# Patient Record
Sex: Male | Born: 1970 | Race: White | Hispanic: No | Marital: Single | Smoking: Current every day smoker
Health system: Southern US, Community
[De-identification: ages and names within clinical notes are randomized; demographics above are authoritative.]

## PROBLEM LIST (undated history)

## (undated) DIAGNOSIS — I10 Essential (primary) hypertension: Secondary | ICD-10-CM

## (undated) DIAGNOSIS — R519 Headache, unspecified: Secondary | ICD-10-CM

## (undated) DIAGNOSIS — E119 Type 2 diabetes mellitus without complications: Secondary | ICD-10-CM

## (undated) DIAGNOSIS — G629 Polyneuropathy, unspecified: Secondary | ICD-10-CM

## (undated) DIAGNOSIS — C801 Malignant (primary) neoplasm, unspecified: Secondary | ICD-10-CM

## (undated) DIAGNOSIS — J449 Chronic obstructive pulmonary disease, unspecified: Secondary | ICD-10-CM

## (undated) DIAGNOSIS — G4730 Sleep apnea, unspecified: Secondary | ICD-10-CM

## (undated) HISTORY — PX: APPENDECTOMY: SHX54

## (undated) HISTORY — PX: OTHER SURGICAL HISTORY: SHX169

---

## 2011-08-11 ENCOUNTER — Emergency Department: Payer: Self-pay | Admitting: Emergency Medicine

## 2014-11-29 DIAGNOSIS — I219 Acute myocardial infarction, unspecified: Secondary | ICD-10-CM

## 2014-11-29 HISTORY — DX: Acute myocardial infarction, unspecified: I21.9

## 2021-09-21 NOTE — H&P (Addendum)
  Patient referred by general dentist for extraction all remaining teeth.  CC: Dental pain.  Past Medical History:  High Blood Pressure, Smoker, Diabetes, Low Blood Sugar, Swollen ankles, Eye Disease or Glaucoma, Mental Health problems, Obese    Medications: Lyrica,  Patient referred by Sheryn Bison, DDS for extraction all remaining teeth.  CC: Dental pain.  Past Medical History:  High Blood Pressure, Smoker, Diabetes, Low Blood Sugar, Swollen ankles, Eye Disease or Glaucoma, Mental Health problems, Obese    Medications: Lyrica, Trulicity, Insulin, Glucagon, Amlodipine, Symbicort, Voltaren, Neurontin, Depakote, Lisinopril, Protonix, Zoloft, Carafate, Topamax,   Allergies:     Aspirin    Surgeries:   Gallbladder removal     Social History       Smoking: 1 ppd           Alcohol: n Drug use:n                             Exam: BMI  34. Gross decay of all remaining teeth. 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  No purulence, edema, fluctuance, trismus. Oral cancer screening negative. Pharynx clear. No lymphadenopathy. Cor-RRR Lungs-Clear  Panorex: from 10/02/2020. Gross decay of all remaining teeth. Impacted #1 CB, retained root #4.l   Assessment: ASA 3. All teeth non-restorable  secondary to dental caries. Lesion right ventral tongue.              Plan: Extraction Teeth # 1 (CB), 3, 4 (retained root), 5, 6, 7, 8, 9, 10, 11, 12, 13, 14, 15, 20, 21, 22, 25, 26, 27, 28, 29, 30. Alveoloplasty. Biopsy tongue lesion.  Hospital Day surgery.                 Rx: n               Risks and complications explained. Questions answered.   Gae Bon, DMD   Allergies:     Aspirin    Surgeries:   Gallbladder removal     Social History       Smoking: 1 ppd           Alcohol: n Drug use:n                             Exam: BMI  34. Gross decay of all remaining teeth. 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  No purulence, edema, fluctuance, trismus. Oral  cancer screening negative. Pharynx clear. No lymphadenopathy.  Panorex: from 10/02/2020. Gross decay of all remaining teeth. Impacted #1 CB, retained root #4.l   Assessment: ASA 3. All teeth non-restorable  secondary to dental caries. Lesion right ventral tongue.              Plan: Extraction Teeth # 1 (CB), 3, 4 (retained root), 5, 6, 7, 8, 9, 10, 11, 12, 13, 14, 15, 20, 21, 22, 25, 26, 27, 28, 29, 30. Alveoloplasty. Biopsy tongue lesion.  Hospital Day surgery.                 Rx: n               Risks and complications explained. Questions answered.   Gae Bon, DMD

## 2021-09-24 ENCOUNTER — Encounter (HOSPITAL_COMMUNITY): Payer: Self-pay | Admitting: Oral Surgery

## 2021-09-24 NOTE — Progress Notes (Signed)
Anesthesia Chart Review: Same day workup  Pertinent medical history includes dyslipidemia, IDDM2, HTN, GERD, COPD, OSA on CPAP.  Evaluated by cardiology at [facility] November 2021 for atypical chest pain.  Echo showed mildly dilated left ventricle, EF 55-60%.  No significant valvular stenosis or regurgitation. Cardiolite 09/29/2020 showed no inducible ischemia.  Uncontrolled IDDM2, last A1c 8.5 on 09/11/21. This si followed by Vance Peper, NP. Pt is maintained on Trulicity and insulin P-824. He was instructed to call prescriber's office for instruction on U-500 inulin.  Patient will need day of surgery labs and evaluation.  Nuclear stress 09/29/20 (care everywhere): Impression:  1. Normal vasodilator stress ECG.  2. Low-normal LV systolic function with normal wall motion.  3. No evidence of inducible ischemia by MPI.  4. Low-risk pharmacologic MPI.   TTE 09/29/20 (care everywhere): Left Ventricle: Mildly dilated left ventricular size.    Left Ventricle: Wall thickness is normal.    Left Ventricle: Systolic function is normal. EF: 55-60%.    Left Ventricle: Wall motion is normal.    Right Ventricle: Right ventricle was not well visualized. Right  ventricle appears normal.    Right Ventricle: Systolic function is normal. Normal tricuspid annular  plane systolic excursion (TAPSE) >1.7 cm.    Pericardium: There is no pericardial effusion.   No significant valvular stenosis or regurgitation.     Wynonia Musty [facility] Short Stay Center/Anesthesiology Phone [phone] 09/24/2021 3:55 PM

## 2021-09-24 NOTE — Progress Notes (Addendum)
[name] denies chest pain or shortness of breath. Patient denies having any s/s of Covid in his household.  Patient denies any known exposure to Covid.  [name] has type II diabetes, patient checks CBG 4 times a day.  Endocrinologist is Transport planner.  [name] is on Novolog U- 500, patient will call  and ask how much Insulin he should take tonight and in the am. Patient takes Trulicity on Wednesday. I instructed [name] to check CBG when he wakes up and every 2 hours until he leaves home to come to the hospital.  I instructed patient that if CBG is  < 80 treat with Glucgon power per  Liliana Pitu, NP's instruction  instructions  Recheck CBg in 15 minutes.  I instructed patient to shower with antibiotic soap, if it is available.  Dry off with a clean towel. Do not put lotion, powder, cologne or deodorant or makeup.No jewelry or piercings. Men may shave their face and neck. Woman should not shave. No nail polish, artificial or acrylic nails. Wear clean clothes, brush your teeth. Glasses, contact lens,dentures or partials may not be worn in the OR. If you need to wear them, please bring a case for glasses, do not wear contacts or bring a case, the hospital does not have contact cases, dentures or partials will have to be removed , make sure they are clean, we will provide a denture cup to put them in. You will need some one to drive you home and a responsible person over the age of 19 to stay with you for the first 24 hours after surgery.

## 2021-09-24 NOTE — Anesthesia Preprocedure Evaluation (Addendum)
Anesthesia Evaluation  Patient identified by MRN, date of birth, ID band Patient awake    Reviewed: Allergy & Precautions, NPO status , Patient's Chart, lab work & pertinent test results  History of Anesthesia Complications Negative for: history of anesthetic complications  Airway Mallampati: II  TM Distance: >3 FB Neck ROM: Full    Dental  (+) Poor Dentition, Dental Advisory Given   Pulmonary sleep apnea and Continuous Positive Airway Pressure Ventilation , COPD, Current Smoker and Patient abstained from smoking.,    Pulmonary exam normal        Cardiovascular hypertension, Normal cardiovascular exam  Nuclear stress 09/29/20 (care everywhere): Impression:  1. Normal vasodilator stress ECG.  2. Low-normal LV systolic function with normal wall motion.  3. No evidence of inducible ischemia by MPI.  4. Low-risk pharmacologic MPI.   TTE 09/29/20 (care everywhere): LeftVentricle: Mildly dilated left ventricular size.  Marland Kitchen LeftVentricle: Wall thickness is normal.  . LeftVentricle: Systolic function is normal. EF: 55-60%.  . LeftVentricle: Wall motion is normal.  . RightVentricle: Right ventricle was not well visualized. Right  ventricle appears normal.  . RightVentricle: Systolic function is normal. Normal tricuspid annular  plane systolic excursion (TAPSE) >1.7 cm.  . Pericardium: There is no pericardial effusion.   No significant valvular stenosis orregurgitation.     Neuro/Psych negative neurological ROS     GI/Hepatic negative GI ROS, Neg liver ROS,   Endo/Other  diabetes  Renal/GU negative Renal ROS     Musculoskeletal negative musculoskeletal ROS (+)   Abdominal   Peds  Hematology negative hematology ROS (+)   Anesthesia Other Findings   Reproductive/Obstetrics                           Anesthesia Physical Anesthesia Plan  ASA: 3  Anesthesia Plan: General    Post-op Pain Management:    Induction: Intravenous  PONV Risk Score and Plan: 2 and Ondansetron, Dexamethasone and Midazolam  Airway Management Planned: Nasal ETT  Additional Equipment:   Intra-op Plan:   Post-operative Plan: Extubation in OR  Informed Consent: I have reviewed the patients History and Physical, chart, labs and discussed the procedure including the risks, benefits and alternatives for the proposed anesthesia with the patient or authorized representative who has indicated his/her understanding and acceptance.     Dental advisory given  Plan Discussed with: Anesthesiologist and CRNA  Anesthesia Plan Comments: (PAT note by Karoline Caldwell, PA-C: Pertinent medical history includes dyslipidemia, IDDM2, HTN, GERD, COPD, OSA on CPAP.  Evaluated by cardiology at [facility] November 2021 for atypical chest pain.  Echo showed mildly dilated left ventricle, EF 55-60%.  No significant valvular stenosis or regurgitation. Cardiolite 09/29/2020 showed no inducible ischemia.  Uncontrolled IDDM2, last A1c 8.5 on 09/11/21. This si followed by Vance Peper, NP. Pt is maintained on Trulicity and insulin Z-767. He was instructed to call prescriber's office for instruction on U-500 inulin.  Patient will need day of surgery labs and evaluation.  Nuclear stress 09/29/20 (care everywhere): Impression:  1. Normal vasodilator stress ECG.  2. Low-normal LV systolic function with normal wall motion.  3. No evidence of inducible ischemia by MPI.  4. Low-risk pharmacologic MPI.   TTE 09/29/20 (care everywhere): LeftVentricle: Mildly dilated left ventricular size.  Marland Kitchen LeftVentricle: Wall thickness is normal.  . LeftVentricle: Systolic function is normal. EF: 55-60%.  . LeftVentricle: Wall motion is normal.  . RightVentricle: Right ventricle was not well visualized. Right  ventricle appears  normal.  . RightVentricle: Systolic function is normal. Normal tricuspid annular  plane  systolic excursion (TAPSE) >1.7 cm.  . Pericardium: There is no pericardial effusion.   No significant valvular stenosis orregurgitation.   )      Anesthesia Quick Evaluation

## 2021-09-25 ENCOUNTER — Ambulatory Visit (HOSPITAL_COMMUNITY): Payer: Medicaid Other | Admitting: Physician Assistant

## 2021-09-25 ENCOUNTER — Ambulatory Visit (HOSPITAL_COMMUNITY): Payer: Medicaid Other

## 2021-09-25 ENCOUNTER — Other Ambulatory Visit: Payer: Self-pay

## 2021-09-25 ENCOUNTER — Encounter (HOSPITAL_COMMUNITY)
Admission: RE | Disposition: A | Discharge status: Home / Self Care | Payer: Self-pay | Source: Home / Self Care | Attending: Oral Surgery

## 2021-09-25 ENCOUNTER — Ambulatory Visit (HOSPITAL_COMMUNITY)
Admission: RE | Admit: 2021-09-25 | Discharge: 2021-09-25 | Disposition: A | Discharge status: Home / Self Care | Payer: Medicaid Other | Attending: Oral Surgery | Admitting: Oral Surgery

## 2021-09-25 ENCOUNTER — Encounter (HOSPITAL_COMMUNITY): Payer: Self-pay | Admitting: Oral Surgery

## 2021-09-25 DIAGNOSIS — Z7984 Long term (current) use of oral hypoglycemic drugs: Secondary | ICD-10-CM | POA: Insufficient documentation

## 2021-09-25 DIAGNOSIS — Z419 Encounter for procedure for purposes other than remedying health state, unspecified: Secondary | ICD-10-CM

## 2021-09-25 DIAGNOSIS — E669 Obesity, unspecified: Secondary | ICD-10-CM | POA: Diagnosis not present

## 2021-09-25 DIAGNOSIS — Z98811 Dental restoration status: Secondary | ICD-10-CM

## 2021-09-25 DIAGNOSIS — I10 Essential (primary) hypertension: Secondary | ICD-10-CM | POA: Diagnosis not present

## 2021-09-25 DIAGNOSIS — K056 Periodontal disease, unspecified: Secondary | ICD-10-CM | POA: Insufficient documentation

## 2021-09-25 DIAGNOSIS — Z886 Allergy status to analgesic agent status: Secondary | ICD-10-CM | POA: Insufficient documentation

## 2021-09-25 DIAGNOSIS — Z7951 Long term (current) use of inhaled steroids: Secondary | ICD-10-CM | POA: Insufficient documentation

## 2021-09-25 DIAGNOSIS — F17200 Nicotine dependence, unspecified, uncomplicated: Secondary | ICD-10-CM | POA: Diagnosis not present

## 2021-09-25 DIAGNOSIS — Z794 Long term (current) use of insulin: Secondary | ICD-10-CM | POA: Insufficient documentation

## 2021-09-25 DIAGNOSIS — E119 Type 2 diabetes mellitus without complications: Secondary | ICD-10-CM | POA: Insufficient documentation

## 2021-09-25 DIAGNOSIS — Z6834 Body mass index (BMI) 34.0-34.9, adult: Secondary | ICD-10-CM | POA: Diagnosis not present

## 2021-09-25 DIAGNOSIS — K029 Dental caries, unspecified: Secondary | ICD-10-CM | POA: Diagnosis not present

## 2021-09-25 DIAGNOSIS — Z791 Long term (current) use of non-steroidal anti-inflammatories (NSAID): Secondary | ICD-10-CM | POA: Insufficient documentation

## 2021-09-25 DIAGNOSIS — Z79899 Other long term (current) drug therapy: Secondary | ICD-10-CM | POA: Diagnosis not present

## 2021-09-25 DIAGNOSIS — K148 Other diseases of tongue: Secondary | ICD-10-CM | POA: Diagnosis not present

## 2021-09-25 DIAGNOSIS — F17210 Nicotine dependence, cigarettes, uncomplicated: Secondary | ICD-10-CM | POA: Diagnosis not present

## 2021-09-25 HISTORY — DX: Type 2 diabetes mellitus without complications: E11.9

## 2021-09-25 HISTORY — PX: TOOTH EXTRACTION: SHX859

## 2021-09-25 HISTORY — DX: Chronic obstructive pulmonary disease, unspecified: J44.9

## 2021-09-25 HISTORY — PX: TONGUE BIOPSY: SHX6575

## 2021-09-25 HISTORY — DX: Headache, unspecified: R51.9

## 2021-09-25 HISTORY — DX: Polyneuropathy, unspecified: G62.9

## 2021-09-25 HISTORY — DX: Sleep apnea, unspecified: G47.30

## 2021-09-25 HISTORY — DX: Malignant (primary) neoplasm, unspecified: C80.1

## 2021-09-25 HISTORY — DX: Essential (primary) hypertension: I10

## 2021-09-25 LAB — BASIC METABOLIC PANEL
Anion gap: 9 (ref 5–15)
BUN: 7 mg/dL (ref 6–20)
CO2: 20 mmol/L — ABNORMAL LOW (ref 22–32)
Calcium: 9.1 mg/dL (ref 8.9–10.3)
Chloride: 107 mmol/L (ref 98–111)
Creatinine, Ser: 0.68 mg/dL (ref 0.61–1.24)
GFR, Estimated: >60 mL/min (ref >60)
Glucose, Bld: 113 mg/dL — ABNORMAL HIGH (ref 70–99)
Potassium: 3.7 mmol/L (ref 3.5–5.1)
Sodium: 136 mmol/L (ref 135–145)

## 2021-09-25 LAB — CBC
HCT: 45.6 % (ref 39.0–52.0)
Hemoglobin: 15.5 g/dL (ref 13.0–17.0)
MCH: 28 pg (ref 26.0–34.0)
MCHC: 34 g/dL (ref 30.0–36.0)
MCV: 82.3 fL (ref 80.0–100.0)
Platelets: 208 10*3/uL (ref 150–400)
RBC: 5.54 MIL/uL (ref 4.22–5.81)
RDW: 13.1 % (ref 11.5–15.5)
WBC: 12.1 10*3/uL — ABNORMAL HIGH (ref 4.0–10.5)
nRBC: 0 % (ref 0.0–0.2)

## 2021-09-25 LAB — GLUCOSE, CAPILLARY: Glucose-Capillary: 118 mg/dL — ABNORMAL HIGH (ref 70–99)

## 2021-09-25 SURGERY — DENTAL RESTORATION/EXTRACTIONS
Anesthesia: General

## 2021-09-25 MED ORDER — FENTANYL CITRATE (PF) 100 MCG/2ML IJ SOLN
INTRAMUSCULAR | Status: AC
  Filled 2021-09-25: qty 2

## 2021-09-25 MED ORDER — CHLORHEXIDINE GLUCONATE 0.12 % MT SOLN
OROMUCOSAL | Status: AC
  Administered 2021-09-25: 15 mL via OROMUCOSAL
  Filled 2021-09-25: qty 15

## 2021-09-25 MED ORDER — 0.9 % SODIUM CHLORIDE (POUR BTL) OPTIME
TOPICAL | Status: DC | PRN
  Administered 2021-09-25: 1000 mL

## 2021-09-25 MED ORDER — CEFAZOLIN SODIUM-DEXTROSE 2-4 GM/100ML-% IV SOLN
2.0000 g | INTRAVENOUS | Status: AC
  Administered 2021-09-25: 2 g via INTRAVENOUS

## 2021-09-25 MED ORDER — SUGAMMADEX SODIUM 200 MG/2ML IV SOLN
INTRAVENOUS | Status: DC | PRN
  Administered 2021-09-25: 20 mg via INTRAVENOUS

## 2021-09-25 MED ORDER — ONDANSETRON HCL 4 MG/2ML IJ SOLN
INTRAMUSCULAR | Status: DC | PRN
  Administered 2021-09-25: 4 mg via INTRAVENOUS

## 2021-09-25 MED ORDER — OXYCODONE-ACETAMINOPHEN 5-325 MG PO TABS: 1.0000 | ORAL_TABLET | ORAL | 0 refills | Status: AC | PRN

## 2021-09-25 MED ORDER — LIDOCAINE-EPINEPHRINE 2 %-1:100000 IJ SOLN
INTRAMUSCULAR | Status: DC | PRN
  Administered 2021-09-25: 20 mL via SUBCUTANEOUS

## 2021-09-25 MED ORDER — OXYCODONE HCL 5 MG PO TABS
5.0000 mg | ORAL_TABLET | Freq: Once | ORAL | Status: AC
  Administered 2021-09-25: 5 mg via ORAL

## 2021-09-25 MED ORDER — FENTANYL CITRATE (PF) 100 MCG/2ML IJ SOLN
INTRAMUSCULAR | Status: DC | PRN
  Administered 2021-09-25: 100 ug via INTRAVENOUS
  Administered 2021-09-25 (×3): 50 ug via INTRAVENOUS

## 2021-09-25 MED ORDER — LACTATED RINGERS IV SOLN: INTRAVENOUS | Status: DC

## 2021-09-25 MED ORDER — FENTANYL CITRATE (PF) 250 MCG/5ML IJ SOLN
INTRAMUSCULAR | Status: AC
  Filled 2021-09-25: qty 5

## 2021-09-25 MED ORDER — LABETALOL HCL 5 MG/ML IV SOLN
INTRAVENOUS | Status: AC
  Filled 2021-09-25: qty 4

## 2021-09-25 MED ORDER — AMISULPRIDE (ANTIEMETIC) 5 MG/2ML IV SOLN: 10.0000 mg | Freq: Once | INTRAVENOUS | Status: DC | PRN

## 2021-09-25 MED ORDER — SODIUM CHLORIDE 0.9 % IR SOLN
Status: DC | PRN
  Administered 2021-09-25: 1000 mL

## 2021-09-25 MED ORDER — PROPOFOL 10 MG/ML IV BOLUS
INTRAVENOUS | Status: AC
  Filled 2021-09-25: qty 20

## 2021-09-25 MED ORDER — MIDAZOLAM HCL 5 MG/5ML IJ SOLN
INTRAMUSCULAR | Status: DC | PRN
  Administered 2021-09-25: 2 mg via INTRAVENOUS

## 2021-09-25 MED ORDER — SUCCINYLCHOLINE CHLORIDE 200 MG/10ML IV SOSY
PREFILLED_SYRINGE | INTRAVENOUS | Status: DC | PRN
  Administered 2021-09-25: 140 mg via INTRAVENOUS

## 2021-09-25 MED ORDER — ORAL CARE MOUTH RINSE: 15.0000 mL | Freq: Once | OROMUCOSAL | Status: AC

## 2021-09-25 MED ORDER — PROMETHAZINE HCL 25 MG/ML IJ SOLN: 6.2500 mg | INTRAMUSCULAR | Status: DC | PRN

## 2021-09-25 MED ORDER — OXYCODONE HCL 5 MG PO TABS
ORAL_TABLET | ORAL | Status: AC
  Filled 2021-09-25: qty 1

## 2021-09-25 MED ORDER — FENTANYL CITRATE (PF) 100 MCG/2ML IJ SOLN
25.0000 ug | INTRAMUSCULAR | Status: DC | PRN
  Administered 2021-09-25 (×3): 50 ug via INTRAVENOUS

## 2021-09-25 MED ORDER — DEXAMETHASONE SODIUM PHOSPHATE 4 MG/ML IJ SOLN
INTRAMUSCULAR | Status: DC | PRN
  Administered 2021-09-25: 10 mg via INTRAVENOUS

## 2021-09-25 MED ORDER — PROPOFOL 10 MG/ML IV BOLUS
INTRAVENOUS | Status: DC | PRN
  Administered 2021-09-25: 200 mg via INTRAVENOUS
  Administered 2021-09-25: 100 mg via INTRAVENOUS

## 2021-09-25 MED ORDER — OXYMETAZOLINE HCL 0.05 % NA SOLN
1.0000 | Freq: Two times a day (BID) | NASAL | Status: DC
  Administered 2021-09-25: 1 via NASAL
  Filled 2021-09-25: qty 30

## 2021-09-25 MED ORDER — ACETAMINOPHEN 500 MG PO TABS
1000.0000 mg | ORAL_TABLET | Freq: Once | ORAL | Status: AC
  Administered 2021-09-25: 1000 mg via ORAL
  Filled 2021-09-25: qty 2

## 2021-09-25 MED ORDER — LABETALOL HCL 5 MG/ML IV SOLN
5.0000 mg | INTRAVENOUS | Status: DC | PRN
  Administered 2021-09-25: 5 mg via INTRAVENOUS

## 2021-09-25 MED ORDER — LIDOCAINE 2% (20 MG/ML) 5 ML SYRINGE
INTRAMUSCULAR | Status: DC | PRN
  Administered 2021-09-25: 50 mg via INTRAVENOUS

## 2021-09-25 MED ORDER — CHLORHEXIDINE GLUCONATE 0.12 % MT SOLN: 15.0000 mL | Freq: Once | OROMUCOSAL | Status: AC

## 2021-09-25 MED ORDER — CEFAZOLIN SODIUM-DEXTROSE 2-4 GM/100ML-% IV SOLN
INTRAVENOUS | Status: AC
  Filled 2021-09-25: qty 100

## 2021-09-25 MED ORDER — ALBUTEROL SULFATE HFA 108 (90 BASE) MCG/ACT IN AERS
INHALATION_SPRAY | RESPIRATORY_TRACT | Status: DC | PRN
  Administered 2021-09-25 (×3): 2 via RESPIRATORY_TRACT

## 2021-09-25 MED ORDER — AMOXICILLIN 500 MG PO CAPS: 500.0000 mg | ORAL_CAPSULE | Freq: Three times a day (TID) | ORAL | 0 refills | Status: AC

## 2021-09-25 MED ORDER — ROCURONIUM BROMIDE 10 MG/ML (PF) SYRINGE
PREFILLED_SYRINGE | INTRAVENOUS | Status: DC | PRN
  Administered 2021-09-25: 40 mg via INTRAVENOUS

## 2021-09-25 MED ORDER — LIDOCAINE-EPINEPHRINE 2 %-1:100000 IJ SOLN
INTRAMUSCULAR | Status: AC
  Filled 2021-09-25: qty 1

## 2021-09-25 MED ORDER — LACTATED RINGERS IV SOLN: INTRAVENOUS | Status: DC | PRN

## 2021-09-25 MED ORDER — MIDAZOLAM HCL 2 MG/2ML IJ SOLN
INTRAMUSCULAR | Status: AC
  Filled 2021-09-25: qty 2

## 2021-09-25 SURGICAL SUPPLY — 40 items
BAG COUNTER SPONGE SURGICOUNT (BAG) IMPLANT
BLADE SURG 15 STRL LF DISP TIS (BLADE) ×1 IMPLANT
BLADE SURG 15 STRL SS (BLADE) ×1
BUR CROSS CUT FISSURE 1.6 (BURR) ×2 IMPLANT
BUR EGG ELITE 4.0 (BURR) ×2 IMPLANT
CANISTER SUCT 3000ML PPV (MISCELLANEOUS) ×2 IMPLANT
COVER SURGICAL LIGHT HANDLE (MISCELLANEOUS) ×2 IMPLANT
DECANTER SPIKE VIAL GLASS SM (MISCELLANEOUS) ×2 IMPLANT
DRAPE U-SHAPE 76X120 STRL (DRAPES) IMPLANT
ELECT REM PT RETURN 9FT ADLT (ELECTROSURGICAL) ×2
ELECTRODE REM PT RTRN 9FT ADLT (ELECTROSURGICAL) ×1 IMPLANT
GAUZE 4X4 16PLY ~~LOC~~+RFID DBL (SPONGE) ×2 IMPLANT
GAUZE PACKING FOLDED 2  STR (GAUZE/BANDAGES/DRESSINGS) ×1
GAUZE PACKING FOLDED 2 STR (GAUZE/BANDAGES/DRESSINGS) ×1 IMPLANT
GLOVE SURG ENC MOIS LTX SZ6.5 (GLOVE) IMPLANT
GLOVE SURG ENC MOIS LTX SZ7 (GLOVE) ×2 IMPLANT
GLOVE SURG ENC MOIS LTX SZ8 (GLOVE) ×2 IMPLANT
GLOVE SURG UNDER POLY LF SZ6.5 (GLOVE) IMPLANT
GLOVE SURG UNDER POLY LF SZ7 (GLOVE) ×2 IMPLANT
GOWN STRL REUS W/ TWL LRG LVL3 (GOWN DISPOSABLE) ×1 IMPLANT
GOWN STRL REUS W/ TWL XL LVL3 (GOWN DISPOSABLE) ×1 IMPLANT
GOWN STRL REUS W/TWL LRG LVL3 (GOWN DISPOSABLE) ×1
GOWN STRL REUS W/TWL XL LVL3 (GOWN DISPOSABLE) ×1
IV NS 1000ML (IV SOLUTION) ×1
IV NS 1000ML BAXH (IV SOLUTION) ×1 IMPLANT
KIT BASIN OR (CUSTOM PROCEDURE TRAY) ×2 IMPLANT
KIT TURNOVER KIT B (KITS) ×2 IMPLANT
NEEDLE HYPO 25GX1X1/2 BEV (NEEDLE) ×4 IMPLANT
NS IRRIG 1000ML POUR BTL (IV SOLUTION) ×2 IMPLANT
PAD ARMBOARD 7.5X6 YLW CONV (MISCELLANEOUS) ×2 IMPLANT
PENCIL BUTTON HOLSTER BLD 10FT (ELECTRODE) ×2 IMPLANT
SLEEVE IRRIGATION ELITE 7 (MISCELLANEOUS) ×2 IMPLANT
SPONGE SURGIFOAM ABS GEL 12-7 (HEMOSTASIS) IMPLANT
SUT CHROMIC 3 0 PS 2 (SUTURE) ×6 IMPLANT
SUT CHROMIC 4 0 PS 5 (SUTURE) ×2 IMPLANT
SYR BULB IRRIG 60ML STRL (SYRINGE) IMPLANT
SYR CONTROL 10ML LL (SYRINGE) ×2 IMPLANT
TRAY ENT MC OR (CUSTOM PROCEDURE TRAY) ×2 IMPLANT
TUBING IRRIGATION (MISCELLANEOUS) ×2 IMPLANT
YANKAUER SUCT BULB TIP NO VENT (SUCTIONS) ×2 IMPLANT

## 2021-09-25 NOTE — Progress Notes (Signed)
At 1419, pt placed in phase II. Waiting on final read of chest x ray and KUB for discharge.

## 2021-09-25 NOTE — Transfer of Care (Signed)
Immediate Anesthesia Transfer of Care Note  Patient: [name]  Procedure(s) Performed: DENTAL RESTORATION/EXTRACTIONS TONGUE BIOPSY  Patient Location: PACU  Anesthesia Type:General  Level of Consciousness: awake and oriented  Airway & Oxygen Therapy: Patient Spontanous Breathing and Patient connected to face mask  Post-op Assessment: Report given to RN and Post -op Vital signs reviewed and stable  Post vital signs: stable  Last Vitals:  Vitals Value Taken Time  BP    Temp    Pulse 103 09/25/21 1303  Resp 25 09/25/21 1303  SpO2 89 % 09/25/21 1303  Vitals shown include unvalidated device data.  Last Pain:  Vitals:   09/25/21 0816  TempSrc:   PainSc: 0-No pain      Patients Stated Pain Goal: 3 (96/22/29 7989)  Complications: No notable events documented.

## 2021-09-25 NOTE — Op Note (Signed)
09/25/2021  12:46 PM  PATIENT:  [name]  50 y.o. male  PRE-OPERATIVE DIAGNOSIS:  NON-RESTORABLE TEETH # 3, 4, 5, 6, 7, 8, 9, 10, 11, 12, 13, 14, 15, 20, 21, 22, 25, 26, 27, 28, 29, 30 SECONDARY TO PERIODONTAL DISEASE AND DENTAL CARIES, LESION RIGHT VENTRAL TONGUE  POST-OPERATIVE DIAGNOSIS:  SAME + TOOTH #4 NOT PRESENT  PROCEDURE:  Procedure(s): DENTAL EXTRACTIONS TEETH # 3, 4, 5, 6, 7, 8, 9, 10, 11, 12, 13, 14, 15, 20, 21, 22, 25, 26, 27, 28, 29, 30 TONGUE BIOPSY  SURGEON:  Surgeon(s): Diona Browner, DMD  ANESTHESIA:   local and general  EBL:  minimal  DRAINS: none   SPECIMEN:  LESION RIGHT TONGUE  COUNTS:  YES  PLAN OF CARE: Discharge to home after PACU  PATIENT DISPOSITION:  PACU - hemodynamically stable.   PROCEDURE DETAILS: Dictation # [id number]  Gae Bon, DMD 09/25/2021 12:46 PM

## 2021-09-25 NOTE — Anesthesia Procedure Notes (Signed)
Procedure Name: Intubation Date/Time: 09/25/2021 11:12 AM Performed by: Yehuda Mao, CRNA Pre-anesthesia Checklist: Patient identified, Emergency Drugs available, Suction available and Patient being monitored Patient Re-evaluated:Patient Re-evaluated prior to induction Oxygen Delivery Method: Circle system utilized Preoxygenation: Pre-oxygenation with 100% oxygen Induction Type: IV induction Ventilation: Mask ventilation without difficulty Laryngoscope Size: Mac and 4 Grade View: Grade III Tube type: Oral Tube size: 7.5 mm Number of attempts: 4 Airway Equipment and Method: Stylet and Oral airway Placement Confirmation: ETT inserted through vocal cords under direct vision, positive ETCO2 and breath sounds checked- equal and bilateral Tube secured with: Tape Dental Injury: Teeth and Oropharynx as per pre-operative assessment

## 2021-09-25 NOTE — Progress Notes (Signed)
H&P documentation  -History and Physical Reviewed  -Patient has been re-examined  -No change in the plan of care  [name]  

## 2021-09-25 NOTE — Anesthesia Postprocedure Evaluation (Signed)
Anesthesia Post Note  Patient: [name]  Procedure(s) Performed: DENTAL RESTORATION/EXTRACTIONS TONGUE BIOPSY     Patient location during evaluation: PACU Anesthesia Type: General Level of consciousness: sedated Pain management: pain level controlled Vital Signs Assessment: post-procedure vital signs reviewed and stable Respiratory status: spontaneous breathing and respiratory function stable Cardiovascular status: stable Postop Assessment: no apparent nausea or vomiting Anesthetic complications: no   No notable events documented.  Last Vitals:  Vitals:   09/25/21 1401 09/25/21 1419  BP: (!) 157/121 (!) 145/110  Pulse: 84 88  Resp: 20 17  Temp:    SpO2: 92% 96%    Last Pain:  Vitals:   09/25/21 1401  TempSrc:   PainSc: 6                  Charitie Hinote DANIEL

## 2021-09-25 NOTE — OR Nursing (Signed)
Per protocol Dr. Lin Landsman called on 09-25-2021 at 1214 to confirm there were no foreign bodies retained or aspirated. MD is aware of results. Birder Robson RN.

## 2021-09-26 ENCOUNTER — Encounter (HOSPITAL_COMMUNITY): Payer: Self-pay | Admitting: Oral Surgery

## 2021-09-26 NOTE — Op Note (Signed)
[name], [name] MEDICAL RECORD NO: [id number] ACCOUNT NO: [id number] DATE OF BIRTH: 08/14/1971 FACILITY: [facility] LOCATION: [facility] PHYSICIAN: Gae Bon, DDS  Operative Report   DATE OF PROCEDURE: 09/25/2021   PREOPERATIVE DIAGNOSIS:   Nonrestorable teeth secondary to dental caries and periodontal disease numbers 3, 4, 5, 6, 7, 8, 9, 10, 11, 12, 13, 14, 15, 20, 21, 22, 25, 26, 27, 28, 29, 30.   POSTOPERATIVE DIAGNOSIS:   Nonrestorable teeth secondary to dental caries and periodontal disease numbers 3, 4, 5, 6, 7, 8, 9, 10, 11, 12, 13, 14, 15, 20, 21, 22, 25, 26, 27, 28, 29, 30.  Tooth #4 not present.   PROCEDURE:  Extraction of teeth numbers 3, 4, 5, 6, 7, 8, 9, 10, 11, 12, 13, 14, 15, 20, 21, 22, 25, 26, 27, 28, 29, 30.    BIOPSY:  Right ventral tongue.   SURGEON:  Gae Bon, DDS.  ANESTHESIA:  General, nasal intubation.    Dr. Tobias Alexander attending.  DESCRIPTION OF PROCEDURE:  The patient was taken to the operating room and placed on the table in supine position.  General anesthesia was administered.  A nasal endotracheal tube was placed and secured.  Eyes were protected and the patient was draped  for surgery.  Timeout was performed.  The posterior pharynx was suctioned and a throat pack was placed.  2% lidocaine with 1:100,000 epinephrine was infiltrated into inferior alveolar block on the right and left sides and then buccal and palatal  infiltration of the maxilla around the teeth to be removed.  Buccal anesthesia was also administered in the anterior mandible.  A bite block was placed on the right side of the mouth and a sweetheart retractor was used to retract the tongue.  A 15 blade  used to make an incision around teeth numbers 20, 21, 22 carried across the midline to tooth #25 and 26 in the buccal and lingual sulcus.  The periosteum was reflected.  The teeth were elevated with 301 elevator and removed from the mouth with a dental  forceps.  The sockets were curetted and  the left mandible teeth #20, 21, 22 was irrigated and then closed with 3-0 chromic.  Attention was turned to the left maxilla.  A 15 blade was used to make an incision around teeth #15, 14, 13, 12, 10, 9, 8, 7.   Periosteum was reflected from around these teeth.  Teeth #15, 14, 13, 12 were elevated with 301 elevator.  The teeth were grasped with dental forceps and teeth #15, 14 and 13 were removed.  When tooth #12 was removed it appeared to fall in the posterior  pharynx to the left side of the throat pack.  Because of abundance of oral tissue and redundancy of oral tissue, it was difficult to see the root as it went beside the throat pack.  The oral cavity was then inspected and the throat pack was removed and  placed on the Mayo stand and the sweetheart retractor was used to try to identify the tooth in the pharynx.  Tooth could not be visualized.  A GlideScope was used to try to visualize the tooth and it was not found.  Then, a straight direct laryngoscopy  was performed using a regular curved Macintosh blade and the laryngoscope, tooth was still not found, although it was difficult to see beyond the  endotracheal tube into the esophagus because of the patient's obesity and redundancy of tissue. Then  the teeth on the dental  Mayo stand were then counted and found to be the correct number of teeth that were removed to this point, it was assumed that perhaps the tooth #12 had fallen into the throat pack, but was somehow wrapped in the throat pack when  the throat pack was removed and was not in the hypopharynx after all, lateral head film was taken as well as several PA neck and chest x-rays, no tooth was visualized.  The radiologist read that there was no tooth visualized, so the procedure was  continued at this point.  Then, surgery resumed and teeth #11, 10, 9, 8, 7 were elevated and removed from the mouth with a dental forceps.  The sockets were curetted.  The left maxilla and anterior maxilla was then  irrigated and closed with 3-0 chromic.  The bite block and  sweetheart retractor were repositioned to the other side of the mouth.  After the tube was repositioned to the left side of the mouth and then incision was created around teeth numbers 20, 21, 22 and 3, 4, 5 and 6.  A 15 blade was used to make an  incision around teeth #25, 26, 27, 28, 29 and 30.  The teeth were elevated and removed from the mouth with a dental forceps.  A 15 blade was used to make an incision around teeth #3, 4, 5 and 6.  The teeth were elevated.  Tissue was reflected and tooth  #4 was not present in the bone.  It appeared to be a residual root on the radiograph, but the radiograph was taken sometime prior to the surgery, the maxillary teeth #3, 5 and 6 were removed with dental forceps and then the right maxilla and mandible  were curetted, irrigated and closed with 3-0 chromic.  Then, attention was turned to the tongue lesion.  The tongue was retracted from the mouth and a fresh 15 blade used to make an elliptical incision around the lesion, which was approximately 1 cm x 1  cm.  The tongue mucosa and underlying portion of muscle was removed and then submitted for pathology.  Then, the tissue was released at the margins of the incision to allow for primary closure.  Bovie electrocautery was used to cauterize bleeding areas  and then the area was closed with 4-0 chromic, multiple interrupted sutures.  Then the oral cavity was irrigated, suctioned and the throat pack was removed.  The patient was left under the care of anesthesia for extubation and transported to recovery  room with plans for discharge on through day surgery.   ESTIMATED BLOOD LOSS:  Minimum.  COMPLICATIONS:  None.   SPECIMEN:  Lesion right lingual tongue.  Counts were correct.   SUJ D: 09/25/2021 12:55:12 pm T: 09/26/2021 12:05:00 am  JOB: [id number]/ [id number]

## 2021-09-28 LAB — SURGICAL PATHOLOGY

## 2023-02-23 IMAGING — CR DG SKULL 1-3V
1 series · 1 of 1 positions shown · non-contrast
Comparison: None.

CLINICAL DATA: Tooth extraction.  Evaluate for aspirated tooth.

EXAM:
SKULL - 1-3 VIEW

[lateral]
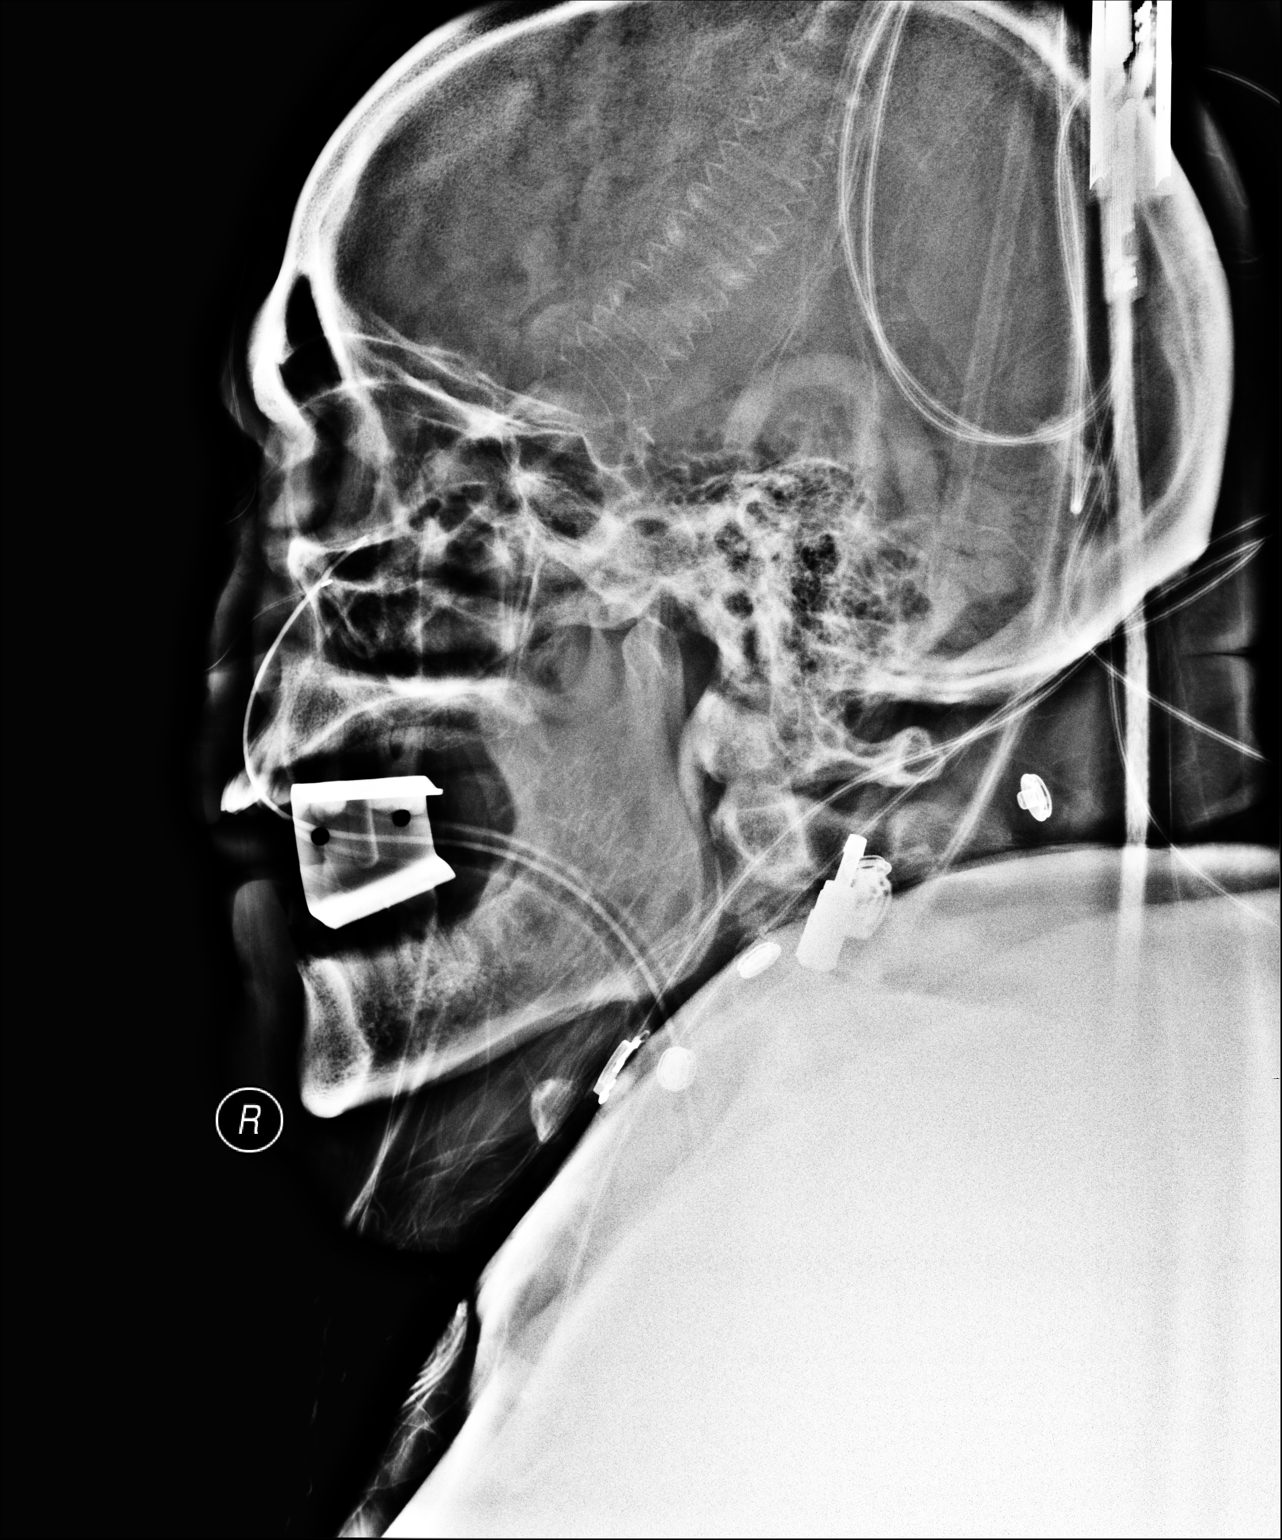

[1 of 1 positions shown; findings below may reference images not displayed]

FINDINGS: Cross-table lateral view of the skull and upper neck demonstrates
the patient to be intubated. There is an indeterminate ossific
appearing density inferior to mandible which does not appear to be a
tooth, and is not within the oral cavity or airway. No displaced
teeth are identified. There is no evidence of acute fracture.
IMPRESSION: 1. No displaced tooth identified in the mouth or upper neck.
2. Indeterminate ossific density inferior to the mandible, possibly
overlying the patient.
3. These results were called by telephone at the time of
provider RAFE JUMPER in the operating room, who verbally
acknowledged these results.

.

## 2023-02-23 IMAGING — CR DG CHEST 1V
1 series · 1 of 1 positions shown · non-contrast
Comparison: None.

CLINICAL DATA: Tooth extraction.  Evaluate for aspirated tooth.

EXAM:
CHEST  1 VIEW

[AP]
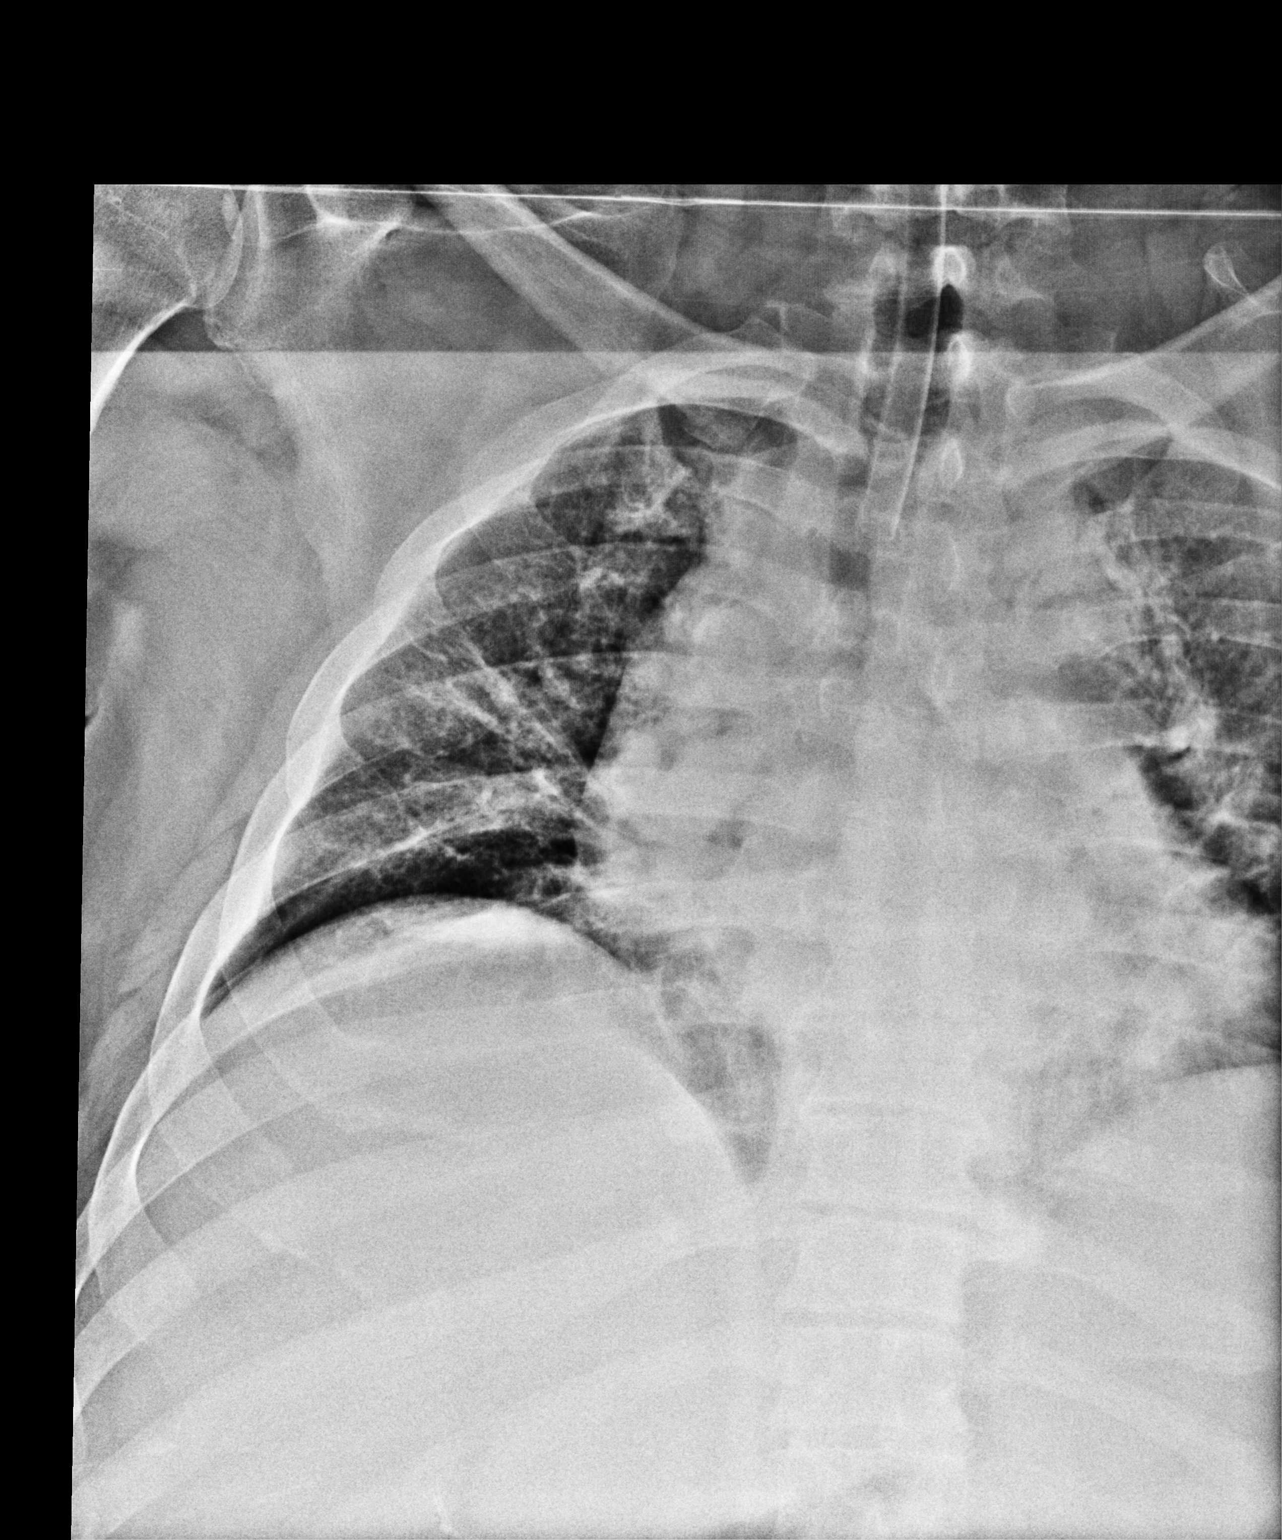

[1 of 1 positions shown; findings below may reference images not displayed]

FINDINGS: Single AP view at 8855 hours excludes portions of the left chest.
Patient is intubated. There is perihilar atelectasis in both lungs.
No aspirated tooth or unexpected foreign body identified. The heart
appears mildly enlarged.
IMPRESSION: No aspirated tooth or foreign body identified within the chest.
Portions of the left chest are excluded. Bilateral perihilar
atelectasis.

These results were called by telephone at the time of interpretation
on 09/25/2021 at [DATE] to representative for provider BLAIN
JAMEELAH in the operating room, who verbally acknowledged these
results.
